# Patient Record
Sex: Female | Born: 1955 | Race: Black or African American | Hispanic: No | Marital: Married | Smoking: Former smoker
Health system: Southern US, Community
[De-identification: ages and names within clinical notes are randomized; demographics above are authoritative.]

## PROBLEM LIST (undated history)

## (undated) DIAGNOSIS — I2699 Other pulmonary embolism without acute cor pulmonale: Secondary | ICD-10-CM

## (undated) DIAGNOSIS — K5090 Crohn's disease, unspecified, without complications: Secondary | ICD-10-CM

## (undated) HISTORY — DX: Crohn's disease, unspecified, without complications: K50.90

## (undated) HISTORY — DX: Other pulmonary embolism without acute cor pulmonale: I26.99

---

## 2002-07-24 ENCOUNTER — Encounter: Payer: Self-pay | Admitting: Internal Medicine

## 2002-07-24 ENCOUNTER — Ambulatory Visit (HOSPITAL_COMMUNITY): Admission: RE | Admit: 2002-07-24 | Discharge: 2002-07-24 | Payer: Self-pay | Admitting: Internal Medicine

## 2004-02-08 ENCOUNTER — Ambulatory Visit (HOSPITAL_COMMUNITY): Admission: RE | Admit: 2004-02-08 | Discharge: 2004-02-08 | Payer: Self-pay | Admitting: Internal Medicine

## 2005-01-11 ENCOUNTER — Ambulatory Visit: Payer: Self-pay | Admitting: Internal Medicine

## 2006-03-27 ENCOUNTER — Ambulatory Visit: Payer: Self-pay | Admitting: Internal Medicine

## 2006-04-05 ENCOUNTER — Ambulatory Visit: Payer: Self-pay | Admitting: Internal Medicine

## 2007-08-06 ENCOUNTER — Inpatient Hospital Stay (HOSPITAL_COMMUNITY): Admission: RE | Admit: 2007-08-06 | Discharge: 2007-08-14 | Payer: Self-pay | Admitting: Family Medicine

## 2007-08-23 ENCOUNTER — Ambulatory Visit (HOSPITAL_COMMUNITY): Admission: RE | Admit: 2007-08-23 | Discharge: 2007-08-23 | Payer: Self-pay | Admitting: Family Medicine

## 2011-02-28 NOTE — Discharge Summary (Signed)
Susan Campbell, Susan Campbell              ACCOUNT NO.:  [id number]   MEDICAL RECORD NO.:  [id number]          PATIENT TYPE:  INP   LOCATION:  A326                          FACILITY:  [facility]   PHYSICIAN:  Dorris Singh, DO    DATE OF BIRTH:  Jul 19, 1956   DATE OF ADMISSION:  08/06/2007  DATE OF DISCHARGE:  10/29/2008LH                               DISCHARGE SUMMARY   ADMISSION DIAGNOSIS:  Deep venous thrombosis of the left leg.   DISCHARGE DIAGNOSES:  1. Bilateral pulmonary edema.  2. Deep venous thrombosis of the left leg.  3. Rash, on Benadryl.  4. History of Crohn's disease.   PRIMARY CARE PHYSICIAN:  Madelin Rear. Sherwood Gambler, MD.   CONSULTATIONS:  None.   DIAGNOSTICS:  1. Two-view chest on August 06, 2007 which showed upper normal heart      size, prominent left hilum potentially related to vascular      strictures, but adenopathy not excluded.  The patient has prior      outside chest radiographs.  Recommend these be obtained.  After      prior films, recommend CT done to exclude left hilar adenopathy.  2. She had a venous Doppler done which demonstrated left lower      extremity DVT in the popliteal and posterior tibial veins.  3. CT angiogram, positive exam for presence of large pulmonary embolus      at right lower lobe with small embolus seen in the left lobe.  No      evidence of lung mass, hilar mass or mediastinal adenopathy.  4. 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   HISTORY/PHYSICAL:  Her history and physical were done by Dr. Dorris Singh.  Please refer to that to summarize.  The patient presented from  Dr. Sharyon Medicus office and was having an ultrasound done, and was found to  have a large DVT of the left leg.  She was then directly admitted.  She  was started on Lovenox and Coumadin.  While she was here, she also had a  chest x-ray done which demonstrated an abnormality which proceeded to a  CT of the chest which  demonstrated bilateral PE.  She maintained here  while allowing her to become therapeutic with therapy.  Her progress  here was uneventful.  When she reached her therapeutic level, it was  determined that she could be discharged to home.   FOLLOW UP:  I personally set up an appointment for her at Dr. Sharyon Medicus  office on Friday to be followed for her INR on the Coumadin at 1:30 on  Friday.  The patient agrees to go.   DISCHARGE MEDICATIONS:  The patient will be sent home on Coumadin 10 mg  1 p.o. daily as well as her Pentasa 500 mg 2 tabs twice daily.   CONDITION:  Good.   DISPOSITION:  Home with follow up instructions with Dr. Sharyon Medicus office  as well as taking the medication as prescribed.      Dorris Singh, DO  Electronically Signed  CB/MEDQ  D:  08/14/2007  T:  08/15/2007  Job:  [id number]   cc:   Madelin Rear. Sherwood Gambler, MD  Fax: [phone]

## 2011-02-28 NOTE — H&P (Signed)
NAMEJAVARIA, Susan Campbell              ACCOUNT NO.:  [id number]   MEDICAL RECORD NO.:  [id number]          PATIENT TYPE:  INP   LOCATION:  A326                          FACILITY:  [facility]   PHYSICIAN:  Dorris Singh, DO    DATE OF BIRTH:  Feb 28, 1956   DATE OF ADMISSION:  08/06/2007  DATE OF DISCHARGE:  LH                              HISTORY & PHYSICAL   The patient is a 55 year old African-American female who presented to  [facility] office with a complaint of swelling of her left leg.  She was  then sent for a Doppler ultrasound here at [facility].  It was  determined that she had a DVT of the left leg.  At that point in time,  she was made a direct admit to our service.   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   SOCIAL HISTORY:  Denies tobacco use, alcohol use or drug use.  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   ALLERGIES:  SHE DENIES ANY ALLERGIES TO ANY MEDICATIONS.   REVIEW OF SYSTEMS:  Positive for left leg swelling.  No pain or  tenderness and no history of DVTs in the past.  She denies any chest  pain, shortness of breath, dyspnea or wheezing, denies any changes in  vision or hearing or smelling or tasting.  Denies nausea, vomiting,  fever, chills, constipation or diarrhea, and denies any dysuria or  frequency.   PHYSICAL EXAMINATION:  VITAL SIGNS:  Are as follows:  Temperature 98,  pulse 85, respirations 20, blood pressure 136/83.  GENERAL:  The patient is a 55 year old African-American female who is  well-developed, well-nourished, in no acute distress.  SKIN:  Intact.  No rashes, bruises or tattoos noted.  HEENT:  Head normocephalic, atraumatic.  Pupils PERRLA, EOMI.  There is  no external auditory canal tenderness or inflammation.  Gross auditory  acuity intact.  Nose:  No turbinate inflammation.   Mouth/throat:  Good  dental hygiene and no erythema or exudate.  NECK:  No masses, full range of motion, no tracheal deviation.  HEART:  Regular rate and rhythm.  No gallops, rubs or clicks.  LUNGS:  Chest symmetrical respiration.  No wheezes, crackles or rales.  ABDOMEN:  Soft, nontender, nondistended.  No guarding or masses noted.  MUSCULOSKELETAL:  No muscular atrophy or weakness.  Full range of motion  of joints.  Positive swelling of the left leg.  No tenderness or redness  but positive warmth.  Increased warmth to the left leg.  NEUROLOGIC:  Cranial nerves 2-12 grossly intact.   LABORATORY:  Pending.  EKG is pending.   ASSESSMENT/PLAN:  1. Deep venous thrombosis of the left leg.  2. Crohn's disease.   PLAN:  Will admit patient to 3A, to the service of [facility].  Pharmacy  to adjust dosed Lovenox and will start patient on Coumadin 5 mg p.o.  daily.  With a daily INR, will obtain basic labs,  CBC, GMET and PT/PTT  prior to instituting therapy.  Will also do DVT and GI prophylaxis.  Will continue to monitor patient.  Will start IV at 100 cc an hour and  will continue to monitor her.      Dorris Singh, DO  Electronically Signed     CB/MEDQ  D:  08/06/2007  T:  08/06/2007  Job:  [id number]

## 2011-02-28 NOTE — Group Therapy Note (Signed)
NAME:  Susan Campbell, Susan Campbell              ACCOUNT NO.:  [id number]   MEDICAL RECORD NO.:  [id number]          PATIENT TYPE:  INP   LOCATION:  A326                          FACILITY:  [facility]   PHYSICIAN:  Marcello Moores, MD   DATE OF BIRTH:  [birth year]   DATE OF PROCEDURE:  DATE OF DISCHARGE:                                 PROGRESS NOTE   HISTORY:  [name] is a 55 year old female patient with history of  Crohn disease and came with left leg swelling from her primary care  office and DVT was diagnosed with Doppler ultrasound.  She was admitted  for treatment.  CAT scan of the chest was done also and showed bilateral  pulmonary embolism.  She is getting on Lovenox and Coumadin.  Subjectively today, she has a rash on her back which started yesterday  itching.   OBJECTIVE:  GENERAL:  She is stable.  VITAL SIGNS:  Temperature 97, pulse 87, respiratory rate 18, blood  pressure 115/69.  HEENT:  She has pink conjunctivae, nonicteric sclerae.  NECK:  Supple.  CHEST:  Good air entry bilateral.  HEART:  S1 and S2 regular.  ABDOMEN:  Soft.  EXTREMITIES:  Left lower leg is slightly swollen than the right leg and  slightly warm.  There is small rash on her left lower leg.  SKIN:  She has extensive urticaria-type of rash on the back.  I am not  sure whether it is related to antibiotic Levaquin which was started 3  days ago or related to her mattress.  It is only on the back.   LABORATORY DATA:  Hemoglobin is 11, hematocrit 34.9, white blood cell 5,  platelets 330, PT 19, INR 1.5.   ASSESSMENT:  1. Pulmonary edema plus deep venous thrombosis.  She is on Lovenox and      Coumadin.  Lovenox today is 80s, but INR is still 1.5 despite she      is on Coumadin 10 mg daily.  Today, she was actually about to be      discharged, but she stated that she did not have any transportation      to come for injections of Lovenox until the INR becomes      therapeutic.  So hopefully tomorrow, she will be able to  have      therapeutic INR and able to go home with stopping Lovenox, to have      follow up with her private medical doctor.  2. Rash.  I will stop Levaquin and we will give her Benadryl and will      watch it.  3. Crohn's.  She is on her own medication and she is continuing to      take her own mesalamine as at home.  The patient advised strongly about her Coumadin, PE and possible filter  placement after discussing with her PMD and the complication of Coumadin  was discussed with her bleeding as well as PE was low INR.     Marcello Moores, MD  Electronically Signed    MT/MEDQ  D:  08/13/2007  T:  08/13/2007  Job:  [id number]

## 2011-07-26 LAB — CBC
HCT: 34 — ABNORMAL LOW
Hemoglobin: 10.9 — ABNORMAL LOW
Hemoglobin: 11.2 — ABNORMAL LOW
Hemoglobin: 11.4 — ABNORMAL LOW
MCHC: 32.6
MCV: 79.1
MCV: 79.2
Platelets: 320
Platelets: 366
RBC: 4.42
WBC: 3.7 — ABNORMAL LOW
WBC: 4.3
WBC: 4.5

## 2011-07-26 LAB — URINALYSIS, ROUTINE W REFLEX MICROSCOPIC
Bilirubin Urine: NEGATIVE
Bilirubin Urine: NEGATIVE
Glucose, UA: NEGATIVE
Glucose, UA: NEGATIVE
Ketones, ur: NEGATIVE
Ketones, ur: NEGATIVE
Protein, ur: NEGATIVE
Specific Gravity, Urine: 1.01
Urobilinogen, UA: 0.2
Urobilinogen, UA: 0.2
pH: 6

## 2011-07-26 LAB — PROTIME-INR
INR: 0.9
INR: 0.9
INR: 1.1
INR: 1.3
INR: 1.5
Prothrombin Time: 12.8
Prothrombin Time: 17 — ABNORMAL HIGH
Prothrombin Time: 17.5 — ABNORMAL HIGH
Prothrombin Time: 23.3 — ABNORMAL HIGH

## 2011-07-26 LAB — DIFFERENTIAL
Basophils Absolute: 0
Basophils Relative: 1
Basophils Relative: 1
Eosinophils Absolute: 0.1
Eosinophils Absolute: 0.1
Eosinophils Relative: 1
Eosinophils Relative: 3
Lymphocytes Relative: 18
Lymphocytes Relative: 36
Lymphs Abs: 0.7
Lymphs Abs: 1.3
Lymphs Abs: 1.5
Lymphs Abs: 1.6
Monocytes Absolute: 0.5
Monocytes Absolute: 0.5
Monocytes Absolute: 0.6
Monocytes Relative: 11
Monocytes Relative: 8
Neutro Abs: 2.1
Neutro Abs: 2.4
Neutro Abs: 2.7
Neutrophils Relative %: 48
Neutrophils Relative %: 55

## 2011-07-26 LAB — URINE CULTURE
Colony Count: >=100000
Special Requests: NEGATIVE

## 2011-07-26 LAB — APTT
aPTT: 27
aPTT: 32

## 2011-07-26 LAB — URINE MICROSCOPIC-ADD ON

## 2011-07-26 LAB — CULTURE, BLOOD (ROUTINE X 2)
Culture: NO GROWTH
Report Status: 10312008

## 2011-07-26 LAB — COMPREHENSIVE METABOLIC PANEL
ALT: 11
Albumin: 2.9 — ABNORMAL LOW
BUN: 5 — ABNORMAL LOW
Chloride: 106
Creatinine, Ser: 0.61
GFR calc non Af Amer: >60
Glucose, Bld: 108 — ABNORMAL HIGH
Potassium: 3.4 — ABNORMAL LOW
Total Bilirubin: 0.7
Total Protein: 6.4

## 2011-07-26 LAB — ANTITHROMBIN III: AntiThromb III Func: 91

## 2011-07-26 LAB — CARDIOLIPIN ANTIBODIES, IGG, IGM, IGA
Anticardiolipin IgA: <7 — ABNORMAL LOW (ref <13)
Anticardiolipin IgG: 7 — ABNORMAL LOW (ref <11)

## 2013-02-19 ENCOUNTER — Other Ambulatory Visit (HOSPITAL_COMMUNITY): Payer: Self-pay | Admitting: Family Medicine

## 2013-02-19 DIAGNOSIS — Z139 Encounter for screening, unspecified: Secondary | ICD-10-CM

## 2013-02-21 ENCOUNTER — Encounter (INDEPENDENT_AMBULATORY_CARE_PROVIDER_SITE_OTHER): Payer: Self-pay | Admitting: *Deleted

## 2013-03-06 ENCOUNTER — Ambulatory Visit (HOSPITAL_COMMUNITY)
Admission: RE | Admit: 2013-03-06 | Discharge: 2013-03-06 | Disposition: A | Discharge status: Home / Self Care | Payer: BC Managed Care – PPO | Source: Ambulatory Visit | Attending: Family Medicine | Admitting: Family Medicine

## 2013-03-06 DIAGNOSIS — Z1231 Encounter for screening mammogram for malignant neoplasm of breast: Secondary | ICD-10-CM | POA: Insufficient documentation

## 2013-03-06 DIAGNOSIS — Z139 Encounter for screening, unspecified: Secondary | ICD-10-CM

## 2015-10-05 ENCOUNTER — Other Ambulatory Visit (HOSPITAL_COMMUNITY): Payer: Self-pay | Admitting: Pulmonary Disease

## 2015-10-05 DIAGNOSIS — I82492 Acute embolism and thrombosis of other specified deep vein of left lower extremity: Secondary | ICD-10-CM

## 2015-10-14 ENCOUNTER — Ambulatory Visit (HOSPITAL_COMMUNITY)
Admission: RE | Admit: 2015-10-14 | Discharge: 2015-10-14 | Disposition: A | Discharge status: Home / Self Care | Payer: BLUE CROSS/BLUE SHIELD | Source: Ambulatory Visit | Attending: Pulmonary Disease | Admitting: Pulmonary Disease

## 2015-10-14 DIAGNOSIS — F17200 Nicotine dependence, unspecified, uncomplicated: Secondary | ICD-10-CM | POA: Insufficient documentation

## 2015-10-14 DIAGNOSIS — Z86718 Personal history of other venous thrombosis and embolism: Secondary | ICD-10-CM | POA: Diagnosis present

## 2015-10-14 DIAGNOSIS — I82492 Acute embolism and thrombosis of other specified deep vein of left lower extremity: Secondary | ICD-10-CM | POA: Diagnosis not present

## 2019-10-26 DIAGNOSIS — I2699 Other pulmonary embolism without acute cor pulmonale: Secondary | ICD-10-CM | POA: Insufficient documentation

## 2020-04-29 DIAGNOSIS — D320 Benign neoplasm of cerebral meninges: Secondary | ICD-10-CM | POA: Diagnosis not present

## 2020-04-29 DIAGNOSIS — Z48811 Encounter for surgical aftercare following surgery on the nervous system: Secondary | ICD-10-CM | POA: Diagnosis not present

## 2020-04-29 DIAGNOSIS — D329 Benign neoplasm of meninges, unspecified: Secondary | ICD-10-CM | POA: Diagnosis not present

## 2021-01-18 DIAGNOSIS — M25562 Pain in left knee: Secondary | ICD-10-CM | POA: Diagnosis not present

## 2021-01-31 ENCOUNTER — Other Ambulatory Visit (HOSPITAL_COMMUNITY): Payer: Self-pay | Admitting: Internal Medicine

## 2021-01-31 DIAGNOSIS — Z1231 Encounter for screening mammogram for malignant neoplasm of breast: Secondary | ICD-10-CM

## 2021-02-03 ENCOUNTER — Other Ambulatory Visit: Payer: Self-pay

## 2021-02-03 ENCOUNTER — Ambulatory Visit (HOSPITAL_COMMUNITY)
Admission: RE | Admit: 2021-02-03 | Discharge: 2021-02-03 | Disposition: A | Discharge status: Home / Self Care | Payer: BC Managed Care – PPO | Source: Ambulatory Visit | Attending: Internal Medicine | Admitting: Internal Medicine

## 2021-02-03 DIAGNOSIS — Z1231 Encounter for screening mammogram for malignant neoplasm of breast: Secondary | ICD-10-CM | POA: Diagnosis not present

## 2021-02-17 DIAGNOSIS — H5203 Hypermetropia, bilateral: Secondary | ICD-10-CM | POA: Diagnosis not present

## 2021-02-17 DIAGNOSIS — H25013 Cortical age-related cataract, bilateral: Secondary | ICD-10-CM | POA: Diagnosis not present

## 2021-02-17 DIAGNOSIS — H2513 Age-related nuclear cataract, bilateral: Secondary | ICD-10-CM | POA: Diagnosis not present

## 2021-02-17 DIAGNOSIS — H47292 Other optic atrophy, left eye: Secondary | ICD-10-CM | POA: Diagnosis not present

## 2021-03-08 ENCOUNTER — Encounter: Payer: Self-pay | Admitting: Obstetrics & Gynecology

## 2021-03-08 ENCOUNTER — Ambulatory Visit (INDEPENDENT_AMBULATORY_CARE_PROVIDER_SITE_OTHER): Payer: BC Managed Care – PPO | Admitting: Obstetrics & Gynecology

## 2021-03-08 ENCOUNTER — Other Ambulatory Visit: Payer: Self-pay

## 2021-03-08 ENCOUNTER — Other Ambulatory Visit (HOSPITAL_COMMUNITY)
Admission: RE | Admit: 2021-03-08 | Discharge: 2021-03-08 | Disposition: A | Discharge status: Home / Self Care | Payer: BC Managed Care – PPO | Source: Ambulatory Visit | Attending: Obstetrics & Gynecology | Admitting: Obstetrics & Gynecology

## 2021-03-08 VITALS — BP 145/77 | HR 76 | Ht 62.0 in | Wt 229.0 lb

## 2021-03-08 DIAGNOSIS — Z01419 Encounter for gynecological examination (general) (routine) without abnormal findings: Secondary | ICD-10-CM | POA: Diagnosis not present

## 2021-03-08 DIAGNOSIS — Z1211 Encounter for screening for malignant neoplasm of colon: Secondary | ICD-10-CM

## 2021-03-08 LAB — HEMOCCULT GUIAC POC 1CARD (OFFICE)

## 2021-03-08 NOTE — Progress Notes (Signed)
poc  WELL-WOMAN EXAMINATION Patient name: Susan Campbell MRN [id number]  Date of birth: 09-22-1956 Chief Complaint:   Gynecologic Exam  History of Present Illness:   Susan Campbell is a 65 y.o. postmenopausal female being seen today for a routine well-woman exam.  Today she notes: no acute complaints or concerns   No LMP recorded. Patient is postmenopausal.  Last pap years ago.  Last mammogram: 01/2021. Last colonoscopy: does not plan on completing  Depression screen [facility] 2/9 03/08/2021  Decreased Interest 0  Down, Depressed, Hopeless 0  PHQ - 2 Score 0  Altered sleeping 0  Tired, decreased energy 0  Change in appetite 0  Feeling bad or failure about yourself  0  Trouble concentrating 0  Moving slowly or fidgety/restless 0  Suicidal thoughts 0  PHQ-9 Score 0      Review of Systems:   Pertinent items are noted in HPI Denies any headaches, blurred vision, fatigue, shortness of breath, chest pain, abdominal pain, bowel movements, urination, or intercourse unless otherwise stated above.  Pertinent History Reviewed:  Reviewed past medical,surgical, social and family history.  Reviewed problem list, medications and allergies. Physical Assessment:   Vitals:   03/08/21 1046  BP: (!) 145/77  Pulse: 76  Weight: 103.9 kg  Height: 5\' 2"  (1.575 m)  Body mass index is 41.88 kg/m.        Physical Examination:   General appearance - well appearing, and in no distress  Mental status - alert, oriented to person, place, and time  Psych:  She has a normal mood and affect  Skin - warm and dry, normal color, no suspicious lesions noted  Chest - effort normal, all lung fields clear to auscultation bilaterally  Heart - normal rate and regular rhythm  Neck:  midline trachea, no thyromegaly or nodules  Breasts - breasts appear normal, no suspicious masses, no skin or nipple changes or  axillary nodes  Abdomen - soft, nontender, nondistended, no masses or organomegaly  Pelvic -  VULVA: normal appearing vulva with no masses, tenderness or lesions  VAGINA: normal appearing vagina with normal color and discharge, no lesions  CERVIX: normal appearing cervix without discharge or lesions, no CMT  Thin prep pap is done with HR HPV cotesting  UTERUS: uterus is felt to be normal size, shape, consistency and nontender   ADNEXA: No adnexal masses or tenderness noted.  Rectal - external hemorrhoid noted, normal rectal, good sphincter tone, no masses felt. Hemoccult: negative  Extremities:  No swelling or varicosities noted  Chaperone: & Plan:  1) Well-Woman Exam -pap collected -mammogram up to date -reviewed colon cancer screening- declined colonoscopy, hemocult neg today  Orders Placed This Encounter  Procedures  . POCT occult blood, stool    Meds: No orders of the defined types were placed in this encounter.   Follow-up: Return in about 1 year (around 03/08/2022) for Annual.   03/10/2022, DO Attending Obstetrician & Gynecologist, [facility] for [facility], [facility]

## 2021-03-09 LAB — CYTOLOGY - PAP
Adequacy: ABSENT
Comment: NEGATIVE
Diagnosis: NEGATIVE
High risk HPV: NEGATIVE

## 2021-04-21 DIAGNOSIS — Z79899 Other long term (current) drug therapy: Secondary | ICD-10-CM | POA: Diagnosis not present

## 2021-04-21 DIAGNOSIS — K5090 Crohn's disease, unspecified, without complications: Secondary | ICD-10-CM | POA: Diagnosis not present

## 2021-04-21 DIAGNOSIS — D329 Benign neoplasm of meninges, unspecified: Secondary | ICD-10-CM | POA: Diagnosis not present

## 2021-04-28 DIAGNOSIS — Z0001 Encounter for general adult medical examination with abnormal findings: Secondary | ICD-10-CM | POA: Diagnosis not present

## 2021-04-28 DIAGNOSIS — K5090 Crohn's disease, unspecified, without complications: Secondary | ICD-10-CM | POA: Diagnosis not present

## 2021-04-28 DIAGNOSIS — E785 Hyperlipidemia, unspecified: Secondary | ICD-10-CM | POA: Diagnosis not present

## 2021-04-28 DIAGNOSIS — Z86711 Personal history of pulmonary embolism: Secondary | ICD-10-CM | POA: Diagnosis not present

## 2021-04-28 DIAGNOSIS — Z0000 Encounter for general adult medical examination without abnormal findings: Secondary | ICD-10-CM | POA: Diagnosis not present

## 2022-02-02 IMAGING — MG MM DIGITAL SCREENING BILAT W/ TOMO AND CAD
8 of 14 series · 9 of 40 positions shown · non-contrast
Comparison: Previous exam(s).

CLINICAL DATA: Screening.

EXAM:
DIGITAL SCREENING BILATERAL MAMMOGRAM WITH TOMOSYNTHESIS AND CAD
TECHNIQUE: Bilateral screening digital craniocaudal and mediolateral oblique
mammograms were obtained. Bilateral screening digital breast
tomosynthesis was performed. The images were evaluated with
computer-aided detection.

[L MLO synth-2D (1 of 2)]
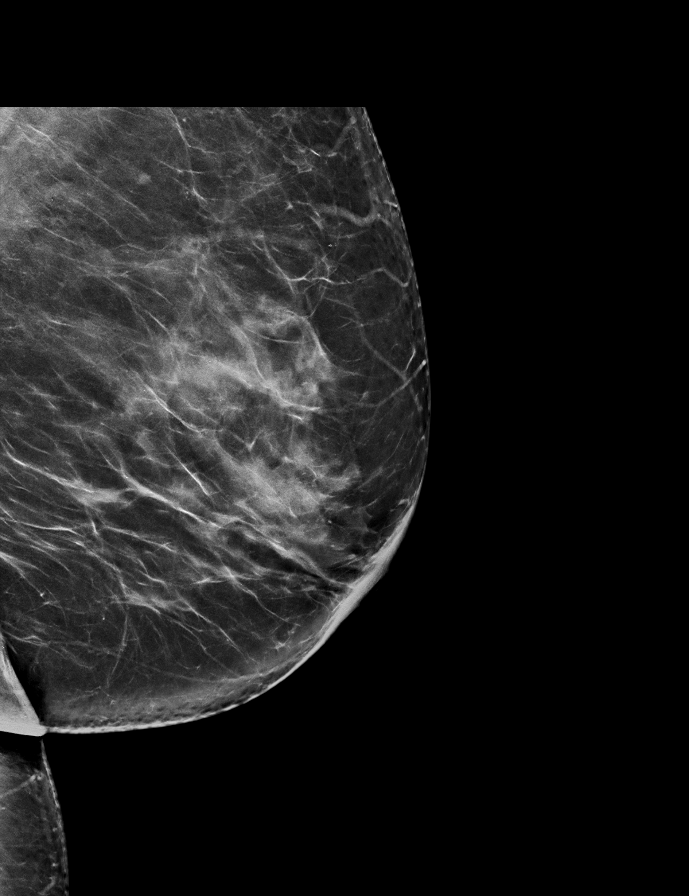

[R CC synth-2D (1 of 2)]
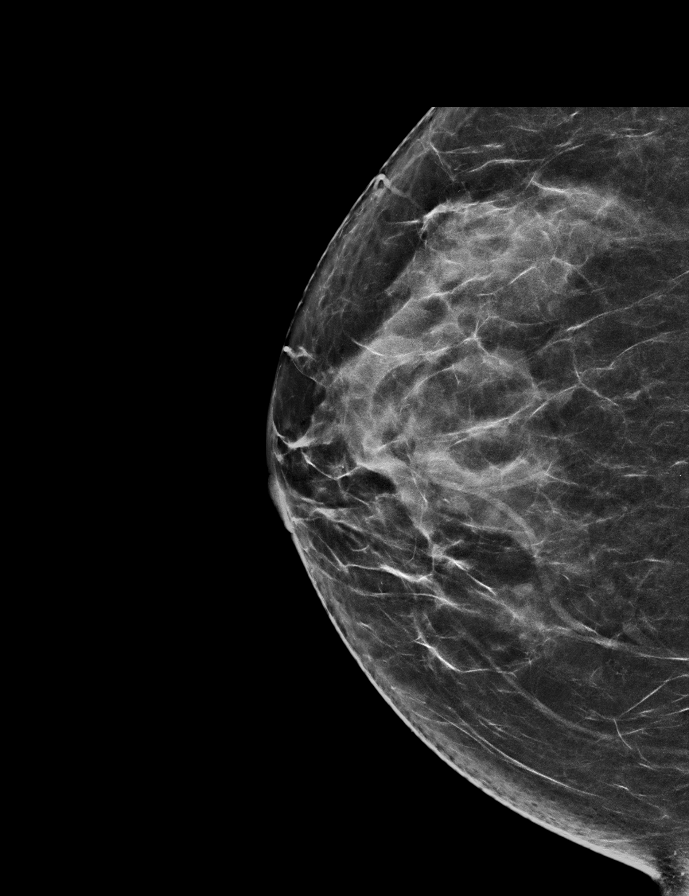

[R MLO synth-2D (1 of 2)]
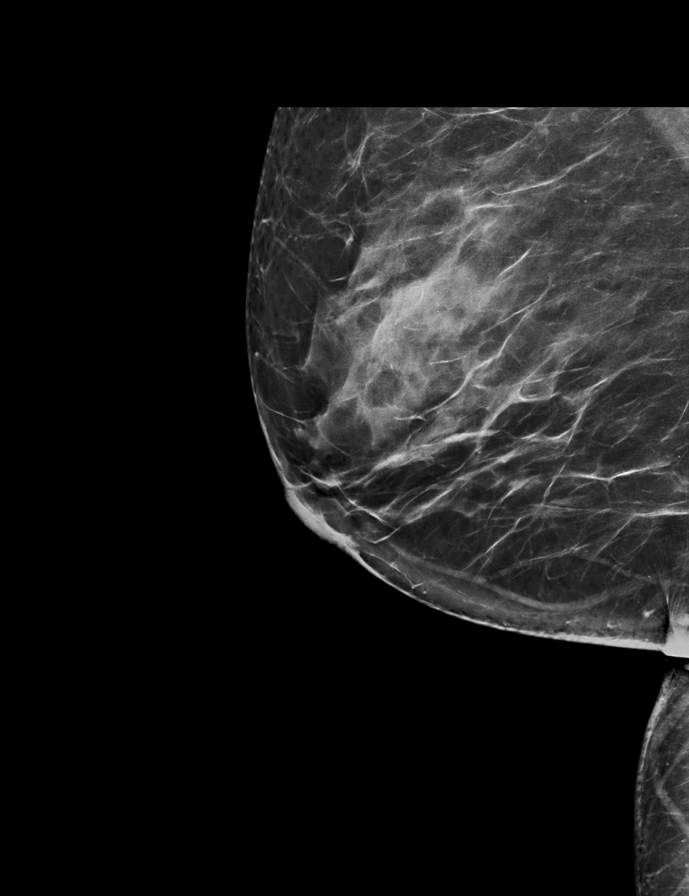

[L CC synth-2D]
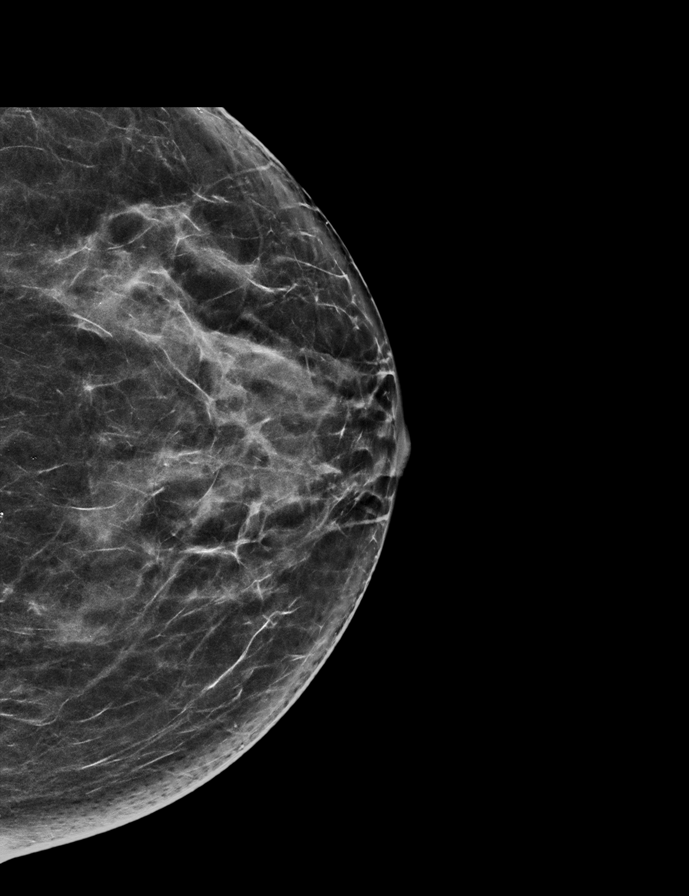

[L MLO synth-2D (2 of 2)]
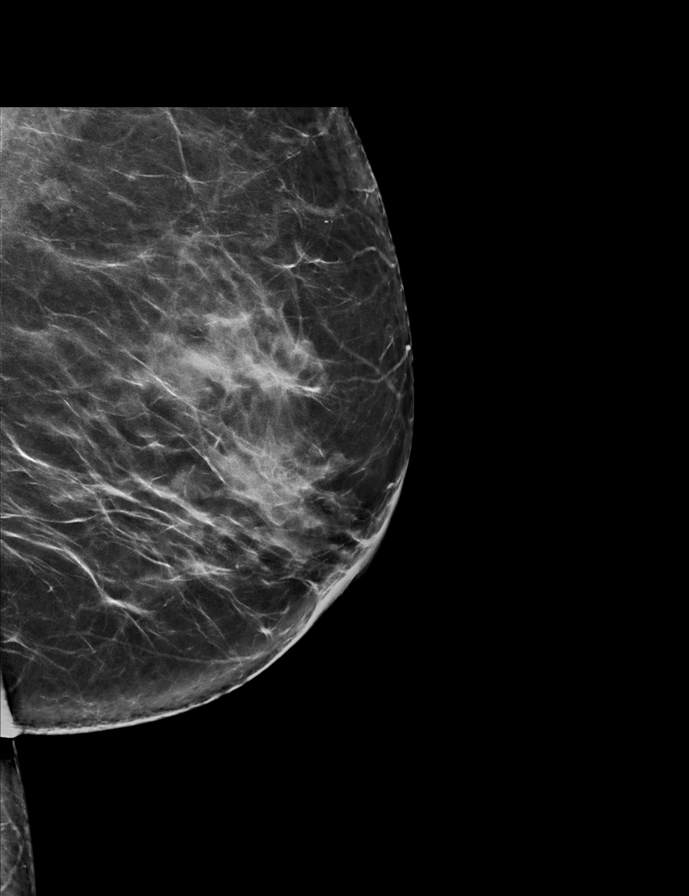

[R MLO synth-2D (2 of 2)]
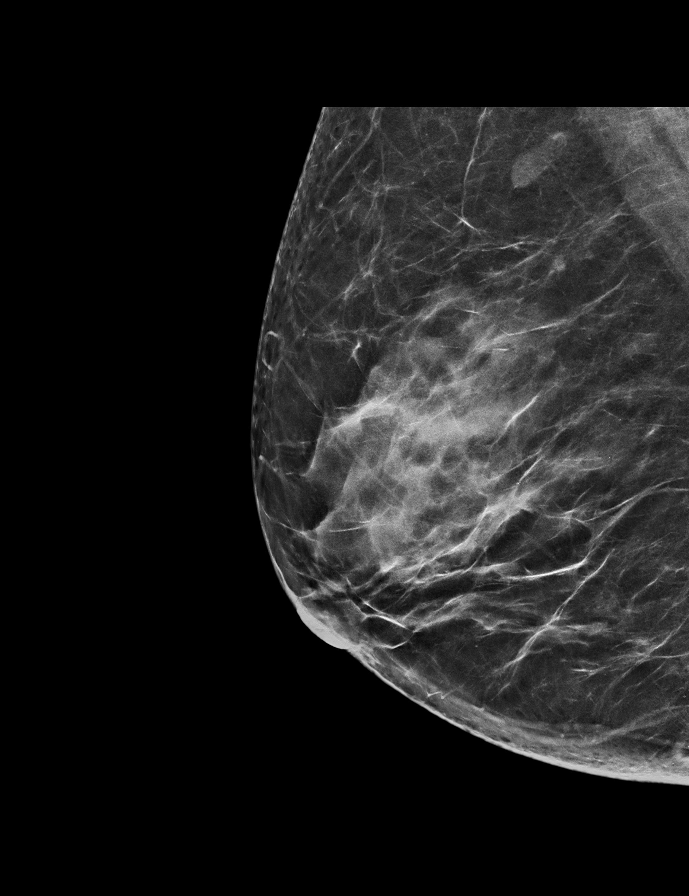

[R CC synth-2D (2 of 2)]
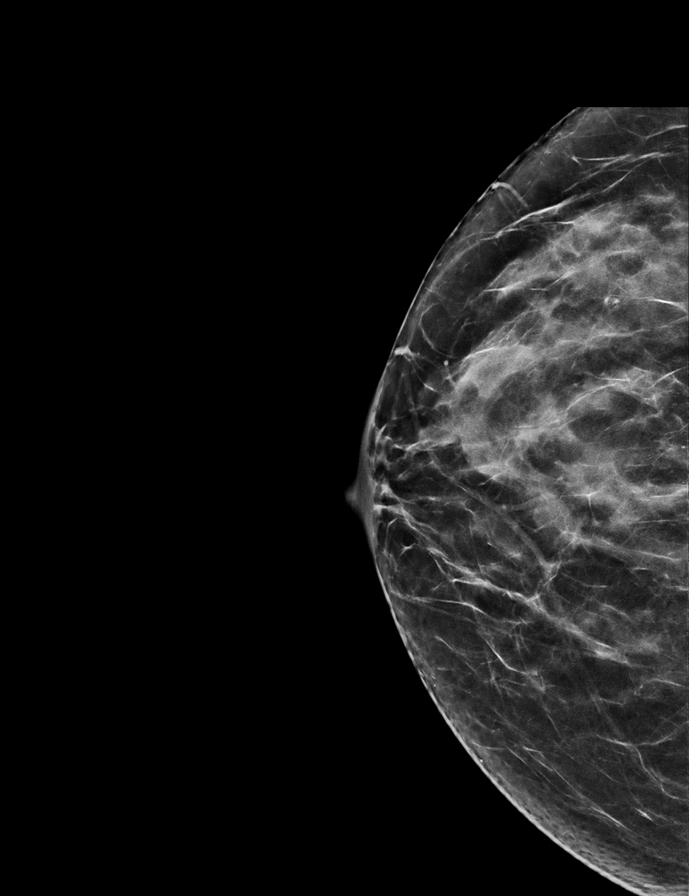

[R CC tomo · 2 of 69 frames shown]
[frame 23/69]
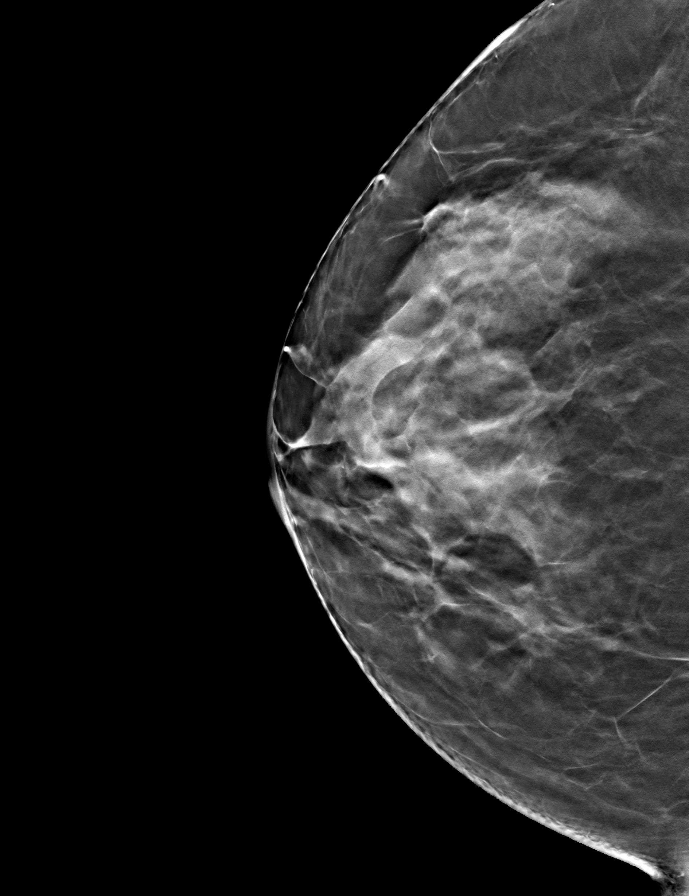
[frame 35/69]
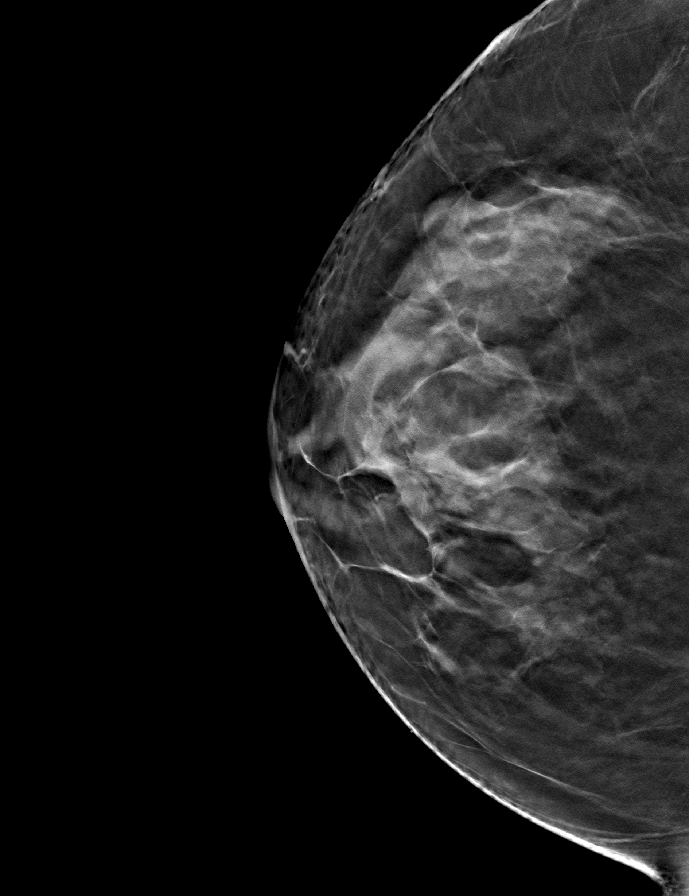

[9 of 40 positions shown; findings below may reference images not displayed]

ACR Breast Density Category c: The breast tissue is heterogeneously
dense, which may obscure small masses.
FINDINGS: There are no findings suspicious for malignancy. The images were
evaluated with computer-aided detection.
IMPRESSION: No mammographic evidence of malignancy. A result letter of this
screening mammogram will be mailed directly to the patient.

RECOMMENDATION:
Screening mammogram in one year. (Code:T4-5-GWO)

BI-RADS CATEGORY  1: Negative.
